# Patient Record
Sex: Male | Born: 1975 | Race: White | Hispanic: No | Marital: Single | Smoking: Current every day smoker
Health system: Southern US, Community
[De-identification: ages and names within clinical notes are randomized; demographics above are authoritative.]

## PROBLEM LIST (undated history)

## (undated) DIAGNOSIS — I2510 Atherosclerotic heart disease of native coronary artery without angina pectoris: Secondary | ICD-10-CM

## (undated) DIAGNOSIS — E079 Disorder of thyroid, unspecified: Secondary | ICD-10-CM

## (undated) HISTORY — PX: TYMPANOSTOMY TUBE PLACEMENT: SHX32

## (undated) HISTORY — DX: Atherosclerotic heart disease of native coronary artery without angina pectoris: I25.10

## (undated) HISTORY — DX: Disorder of thyroid, unspecified: E07.9

---
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
## 2006-02-16 ENCOUNTER — Emergency Department: Payer: Self-pay | Admitting: Internal Medicine

## 2006-10-04 ENCOUNTER — Emergency Department: Payer: Self-pay | Admitting: Emergency Medicine

## 2008-10-05 ENCOUNTER — Emergency Department: Payer: Self-pay | Admitting: Emergency Medicine

## 2010-11-11 ENCOUNTER — Ambulatory Visit: Payer: Self-pay | Admitting: Cardiology

## 2010-11-11 DIAGNOSIS — I2510 Atherosclerotic heart disease of native coronary artery without angina pectoris: Secondary | ICD-10-CM

## 2010-11-11 HISTORY — DX: Atherosclerotic heart disease of native coronary artery without angina pectoris: I25.10

## 2013-05-07 ENCOUNTER — Emergency Department: Payer: Self-pay | Admitting: Emergency Medicine

## 2013-05-07 LAB — CBC WITH DIFFERENTIAL/PLATELET
Basophil %: 0.3 %
Eosinophil %: 0.2 %
HCT: 44.3 % (ref 40.0–52.0)
HGB: 15.4 g/dL (ref 13.0–18.0)
Lymphocyte %: 7.9 %
MCHC: 34.8 g/dL (ref 32.0–36.0)
MCV: 91 fL (ref 80–100)
Monocyte #: 1.9 x10 3/mm — ABNORMAL HIGH (ref 0.2–1.0)
Neutrophil %: 81 %
Platelet: 153 10*3/uL (ref 150–440)
RBC: 4.87 10*6/uL (ref 4.40–5.90)

## 2013-05-07 LAB — MONONUCLEOSIS SCREEN: Mono Test: NEGATIVE

## 2013-09-08 ENCOUNTER — Emergency Department: Payer: Self-pay | Admitting: Emergency Medicine

## 2013-09-08 LAB — CBC
HGB: 13.7 g/dL (ref 13.0–18.0)
MCH: 32.1 pg (ref 26.0–34.0)
MCHC: 35.3 g/dL (ref 32.0–36.0)
MCV: 91 fL (ref 80–100)
Platelet: 209 10*3/uL (ref 150–440)
RBC: 4.27 10*6/uL — ABNORMAL LOW (ref 4.40–5.90)
RDW: 13.2 % (ref 11.5–14.5)
WBC: 10.1 10*3/uL (ref 3.8–10.6)

## 2013-09-08 LAB — BASIC METABOLIC PANEL
Calcium, Total: 8.9 mg/dL (ref 8.5–10.1)
EGFR (Non-African Amer.): >60
Glucose: 132 mg/dL — ABNORMAL HIGH (ref 65–99)
Osmolality: 284 (ref 275–301)

## 2013-09-08 LAB — TROPONIN I
Troponin-I: <0.02 ng/mL
Troponin-I: <0.02 ng/mL

## 2014-12-13 IMAGING — CT CT ANGIO AORTIC DISSECTION W/ CM
3 of 4 series · 6 of 16 positions shown, 7 images · IV contrast (APPLIED)
Comparison: none

REASON FOR EXAM: chest pain radiating to back
COMMENTS:

[Series 9: syng[place]ia · axial · 0.78mm/px · z∈[-600,-432]mm · 3 of 280 slices shown, 4 images]
[im 70/280  soft-tissue]
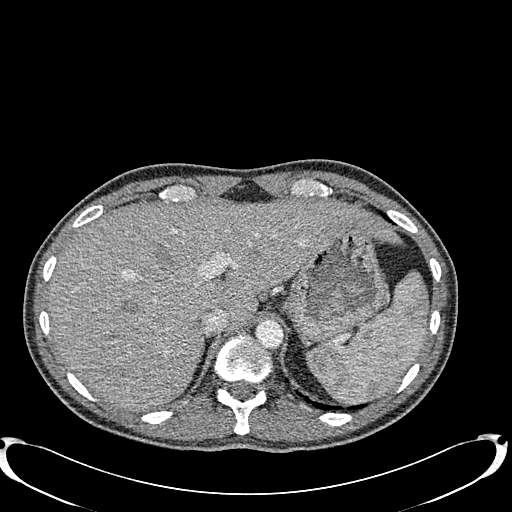
[im 70/280  bone]
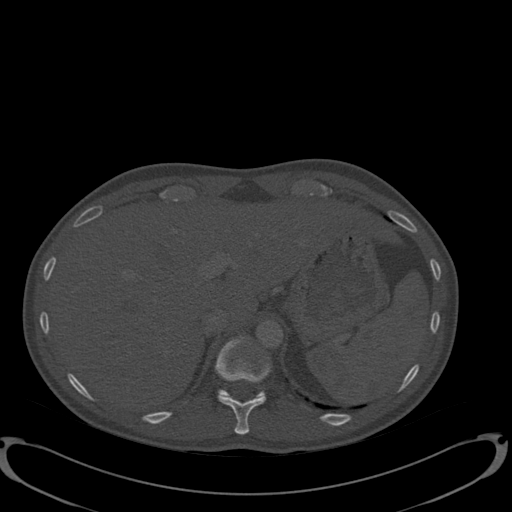
[im 140/280  soft-tissue]
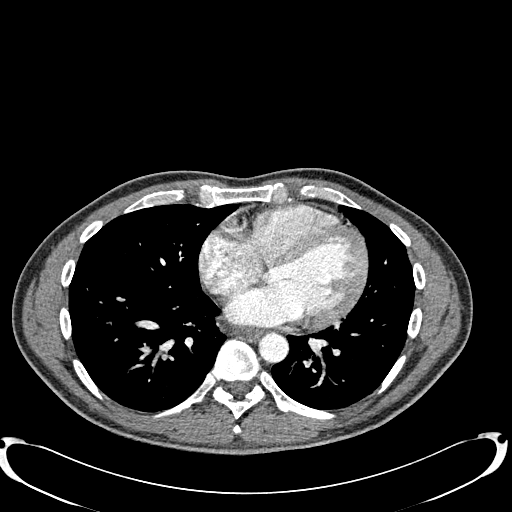
[im 210/280  soft-tissue]
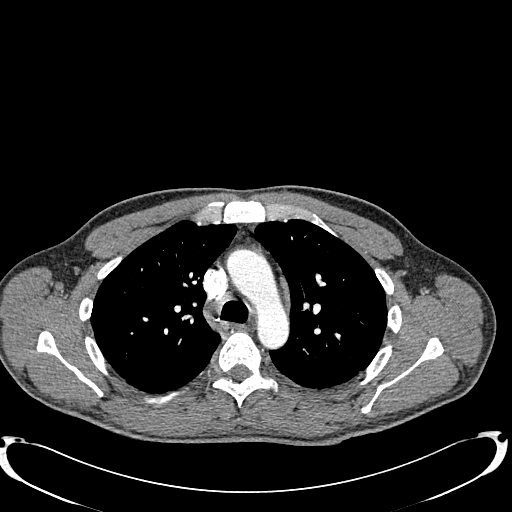

[Series 603: candy cane · sagittal · 0.78mm/px · 1 of 305 slices shown]
[im 153/305  soft-tissue]
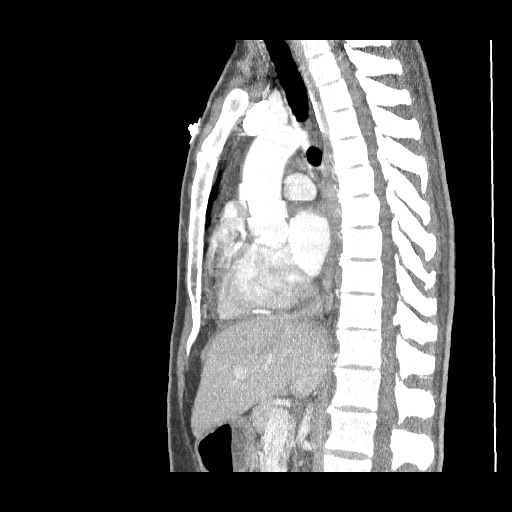

[Series 604: coronal chest · coronal · 0.78mm/px · 2 of 238 slices shown]
[im 80/238  soft-tissue]
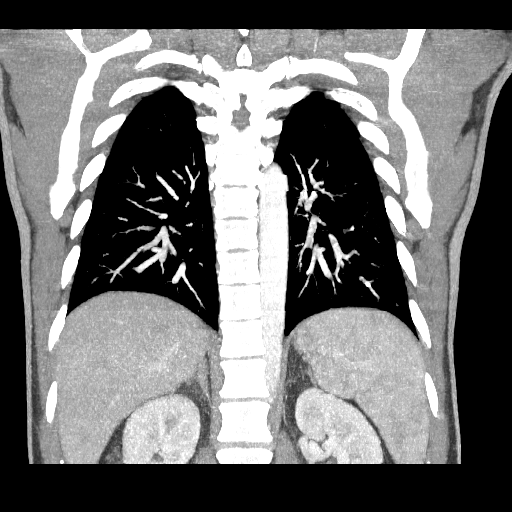
[im 159/238  soft-tissue]
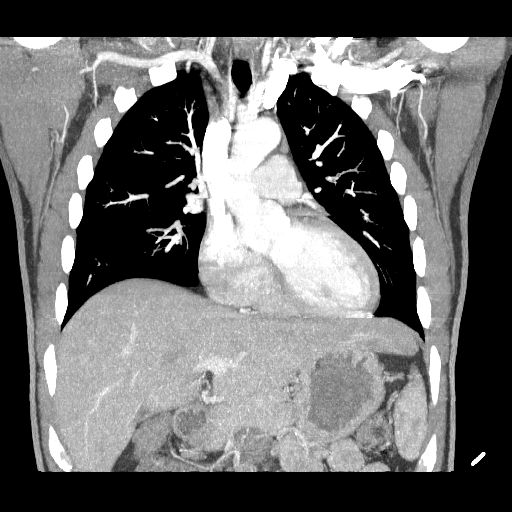

[6 of 16 positions shown; findings below may reference images not displayed]

PROCEDURE:     CT  - CT ANGIO CHEST W FOR DISSECTION  - September 08, 2013  [DATE]

RESULT:     Emergent CTA of the aorta is performed with cardiac gating with
images reconstructed in the axial plane at 3 mm slice thickness as well is
1.5 mm slice thickness with sagittal 8 mm thick slices and coronal
millimeter thick slices also performed. Three-dimensional reconstruction
with maximum intensity projection imaging is also [place]. There is
significant patient respiratory motion artifact which decreases sensitivity.
There is no pneumothorax. There is some small blebs in the lungs inferiorly.
There is no effusion or mass. The thoracic aorta is normal in caliber
without dissection area the pulmonary artery opacification is not optimal
for evaluation of pulmonary emboli, especially with the patient motion
artifact. No definite large central emboli are evident area there is no
mediastinal or hilar mass or adenopathy. Is no pleural or pericardial
effusion. The bony structures appear intact. There is a cyst in the upper
pole the right kidney. The other abdominal viscera appear grossly normal.
The gallbladder is nondistended.
IMPRESSION: 1. Normal appearance of the thoracic aorta. No evidence of aneurysm,
dissection or exsanguination.
2. The lungs are clear.

[REDACTED]

## 2015-09-18 ENCOUNTER — Ambulatory Visit (INDEPENDENT_AMBULATORY_CARE_PROVIDER_SITE_OTHER): Payer: 59 | Admitting: Family Medicine

## 2015-09-18 ENCOUNTER — Encounter: Payer: Self-pay | Admitting: Family Medicine

## 2015-09-18 VITALS — BP 116/79 | HR 71 | Temp 98.3°F | Ht 68.9 in | Wt 179.9 lb

## 2015-09-18 DIAGNOSIS — I2510 Atherosclerotic heart disease of native coronary artery without angina pectoris: Secondary | ICD-10-CM | POA: Diagnosis not present

## 2015-09-18 DIAGNOSIS — E039 Hypothyroidism, unspecified: Secondary | ICD-10-CM | POA: Diagnosis not present

## 2015-09-18 DIAGNOSIS — K921 Melena: Secondary | ICD-10-CM | POA: Diagnosis not present

## 2015-09-18 DIAGNOSIS — Z113 Encounter for screening for infections with a predominantly sexual mode of transmission: Secondary | ICD-10-CM

## 2015-09-18 LAB — UA/M W/RFLX CULTURE, ROUTINE
Bilirubin, UA: NEGATIVE
Glucose, UA: NEGATIVE
KETONES UA: NEGATIVE
LEUKOCYTES UA: NEGATIVE
NITRITE UA: NEGATIVE
PH UA: 6 (ref 5.0–7.5)
PROTEIN UA: NEGATIVE
RBC UA: NEGATIVE
Specific Gravity, UA: 1.01 (ref 1.005–1.030)
Urobilinogen, Ur: 0.2 mg/dL (ref 0.2–1.0)

## 2015-09-18 NOTE — Assessment & Plan Note (Signed)
Per patient report. Had cath with Dr. Evette GeorgesParashos. Will obtain records. Checking CMP and lipids today. Await results.

## 2015-09-18 NOTE — Assessment & Plan Note (Signed)
Was previously on synthroid. Will check TSH and await results and treat as needed. Labs drawn today.

## 2015-09-18 NOTE — Progress Notes (Signed)
BP 116/79 mmHg  Pulse 71  Temp(Src) 98.3 F (36.8 C)  Ht 5' 8.9" (1.75 m)  Wt 179 lb 14.4 oz (81.602 kg)  BMI 26.65 kg/m2  SpO2 98%   Subjective:    Patient ID: Nicholas Bridges, male    DOB: Nov 03, 1976, 39 y.o.   MRN: [id number]  HPI: Nicholas Bridges is a 39 y.o. male to establish care today  Chief Complaint  Patient presents with  . Establish Care    pt states he is worried about his thyroid. States he feels tired and ill all the time. Also states that he has random joint pains.   Had a problem with his thyroid a few years ago, can't remember if it was too high or too low. He was put on synthroid in the past, but hasn't been on it a long time. Taking the medicine made him feel much worse.   HYPOTHYROIDISM Thyroid control status:uncontrolled- has been off medicine for about 5 years Satisfied with current treatment? no Medication side effects: yes- felt like his thoughts were all over the place Medication compliance: has been off for about 5 years Recent dose adjustment:no Fatigue: yes Cold intolerance: yes Heat intolerance: yes Weight gain: yes Weight loss: no Constipation: no Diarrhea/loose stools: no Palpitations: yes Lower extremity edema: no Anxiety/depressed mood: yes   Can have some severe cramping in his belly, that occasionally gets better with food, and occasionally gets worse with food. Intermittently there is blood in his feces, concern about bleeding ulcer.  Had issues with his heart and had to see Dr. Cassie FreerParachos. Had a cath and states that he said that there was an area of unusual plaque. Has had his cholesterol checked at work yearly. Has been taking fish oil and increasing his fiber  Relevant past medical, surgical, family and social history reviewed and updated as indicated. Interim medical history since our last visit reviewed. Allergies and medications reviewed and updated.  Review of Systems  Constitutional: Negative.   Respiratory: Negative.    Cardiovascular: Negative.   Gastrointestinal: Negative.   Musculoskeletal: Negative.   Psychiatric/Behavioral: Negative.    Per HPI unless specifically indicated above     Objective:    BP 116/79 mmHg  Pulse 71  Temp(Src) 98.3 F (36.8 C)  Ht 5' 8.9" (1.75 m)  Wt 179 lb 14.4 oz (81.602 kg)  BMI 26.65 kg/m2  SpO2 98%  Wt Readings from Last 3 Encounters:  09/18/15 179 lb 14.4 oz (81.602 kg)    Physical Exam  Constitutional: He is oriented to person, place, and time. He appears well-developed and well-nourished. No distress.  HENT:  Head: Normocephalic and atraumatic.  Right Ear: Hearing normal.  Left Ear: Hearing normal.  Nose: Nose normal.  Eyes: Conjunctivae and lids are normal. Right eye exhibits no discharge. Left eye exhibits no discharge. No scleral icterus.  Cardiovascular: Normal rate, regular rhythm, normal heart sounds and intact distal pulses.  Exam reveals no gallop and no friction rub.   No murmur heard. Pulmonary/Chest: Effort normal and breath sounds normal. No respiratory distress. He has no wheezes. He has no rales. He exhibits no tenderness.  Musculoskeletal: Normal range of motion.  Neurological: He is alert and oriented to person, place, and time.  Skin: Skin is warm, dry and intact. No rash noted. No erythema. No pallor.  Psychiatric: He has a normal mood and affect. His speech is normal and behavior is normal. Judgment and thought content normal. Cognition and memory are normal.  Nursing  note and vitals reviewed.   Results for orders placed or performed in visit on 09/18/15  UA/M w/rflx Culture, Routine  Result Value Ref Range   Specific Gravity, UA 1.010 1.005 - 1.030   pH, UA 6.0 5.0 - 7.5   Color, UA Yellow Yellow   Appearance Ur Clear Clear   Leukocytes, UA Negative Negative   Protein, UA Negative Negative/Trace   Glucose, UA Negative Negative   Ketones, UA Negative Negative   RBC, UA Negative Negative   Bilirubin, UA Negative Negative    Urobilinogen, Ur 0.2 0.2 - 1.0 mg/dL   Nitrite, UA Negative Negative      Assessment & Plan:   Problem List Items Addressed This Visit      Cardiovascular and Mediastinum   Coronary artery disease involving native coronary artery of native heart without angina pectoris    Per patient report. Had cath with Dr. Evette Georges. Will obtain records. Checking CMP and lipids today. Await results.       Relevant Orders   CBC with Differential/Platelet   Comprehensive metabolic panel   Lipid Panel w/o Chol/HDL Ratio   TSH     Endocrine   Thyroid activity decreased - Primary    Was previously on synthroid. Will check TSH and await results and treat as needed. Labs drawn today.       Relevant Orders   TSH     Other   Hematochezia    Concerning for possible colitis with the cramping and the blood. Will check CMP and CBC and sent him home with FOBTx3. Low threshold for referral to GI for colonoscopy. Await results.       Relevant Orders   CBC with Differential/Platelet   UA/M w/rflx Culture, Routine (Completed)    Other Visit Diagnoses    Routine screening for STI (sexually transmitted infection)        Labs ordered today. Await results.     Relevant Orders    HIV antibody    GC/Chlamydia Probe Amp    HSV(herpes simplex vrs) 1+2 ab-IgG    Hepatitis, Acute    RPR        Follow up plan: Return 1-2 weeks, for PE, records release Dr. Cassie Freer and previous PCP.

## 2015-09-18 NOTE — Assessment & Plan Note (Signed)
Concerning for possible colitis with the cramping and the blood. Will check CMP and CBC and sent him home with FOBTx3. Low threshold for referral to GI for colonoscopy. Await results.

## 2015-09-18 NOTE — Patient Instructions (Signed)
Gastrointestinal Bleeding Gastrointestinal (GI) bleeding means there is bleeding somewhere along the digestive tract, between the mouth and anus. CAUSES  There are many different problems that can cause GI bleeding. Possible causes include:  Esophagitis. This is inflammation, irritation, or swelling of the esophagus.  Hemorrhoids.These are veins that are full of blood (engorged) in the rectum. They cause pain, inflammation, and may bleed.  Anal fissures.These are areas of painful tearing which may bleed. They are often caused by passing hard stool.  Diverticulosis.These are pouches that form on the colon over time, with age, and may bleed significantly.  Diverticulitis.This is inflammation in areas with diverticulosis. It can cause pain, fever, and bloody stools, although bleeding is rare.  Polyps and cancer. Colon cancer often starts out as precancerous polyps.  Gastritis and ulcers.Bleeding from the upper gastrointestinal tract (near the stomach) may travel through the intestines and produce black, sometimes tarry, often bad smelling stools. In certain cases, if the bleeding is fast enough, the stools may not be black, but red. This condition may be life-threatening. SYMPTOMS   Vomiting bright red blood or material that looks like coffee grounds.  Bloody, black, or tarry stools. DIAGNOSIS  Your caregiver may diagnose your condition by taking your history and performing a physical exam. More tests may be needed, including:  X-rays and other imaging tests.  Esophagogastroduodenoscopy (EGD). This test uses a flexible, lighted tube to look at your esophagus, stomach, and small intestine.  Colonoscopy. This test uses a flexible, lighted tube to look at your colon. TREATMENT  Treatment depends on the cause of your bleeding.   For bleeding from the esophagus, stomach, small intestine, or colon, the caregiver doing your EGD or colonoscopy may be able to stop the bleeding as part of  the procedure.  Inflammation or infection of the colon can be treated with medicines.  Many rectal problems can be treated with creams, suppositories, or warm baths.  Surgery is sometimes needed.  Blood transfusions are sometimes needed if you have lost a lot of blood. If bleeding is slow, you may be allowed to go home. If there is a lot of bleeding, you will need to stay in the hospital for observation. HOME CARE INSTRUCTIONS   Take any medicines exactly as prescribed.  Keep your stools soft by eating foods that are high in fiber. These foods include whole grains, legumes, fruits, and vegetables. Prunes (1 to 3 a day) work well for many people.  Drink enough fluids to keep your urine clear or pale yellow. SEEK IMMEDIATE MEDICAL CARE IF:   Your bleeding increases.  You feel lightheaded, weak, or you faint.  You have severe cramps in your back or abdomen.  You pass large blood clots in your stool.  Your problems are getting worse. MAKE SURE YOU:   Understand these instructions.  Will watch your condition.  Will get help right away if you are not doing well or get worse.   This information is not intended to replace advice given to you by your health care provider. Make sure you discuss any questions you have with your health care provider.   Document Released: 11/05/2000 Document Revised: 10/25/2012 Document Reviewed: 04/28/2015 Elsevier Interactive Patient Education 2016 [employer] Hypothyroidism Hypothyroidism is a disorder of the thyroid. The thyroid is a large gland that is located in the lower front of the neck. The thyroid releases hormones that control how the body works. With hypothyroidism, the thyroid does not make enough of these hormones. CAUSES Causes  of hypothyroidism may include:  Viral infections.  Pregnancy.  Your own defense system (immune system) attacking your thyroid.  Certain medicines.  Birth defects.  Past radiation treatments to your  head or neck.  Past treatment with radioactive iodine.  Past surgical removal of part or all of your thyroid.  Problems with the gland that is located in the center of your brain (pituitary). SIGNS AND SYMPTOMS Signs and symptoms of hypothyroidism may include:  Feeling as though you have no energy (lethargy).  Inability to tolerate cold.  Weight gain that is not explained by a change in diet or exercise habits.  Dry skin.  Coarse hair.  Menstrual irregularity.  Slowing of thought processes.  Constipation.  Sadness or depression. DIAGNOSIS  Your health care provider may diagnose hypothyroidism with blood tests and ultrasound tests. TREATMENT Hypothyroidism is treated with medicine that replaces the hormones that your body does not make. After you begin treatment, it may take several weeks for symptoms to go away. HOME CARE INSTRUCTIONS   Take medicines only as directed by your health care provider.  If you start taking any new medicines, tell your health care provider.  Keep all follow-up visits as directed by your health care provider. This is important. As your condition improves, your dosage needs may change. You will need to have blood tests regularly so that your health care provider can watch your condition. SEEK MEDICAL CARE IF:  Your symptoms do not get better with treatment.  You are taking thyroid replacement medicine and:  You sweat excessively.  You have tremors.  You feel anxious.  You lose weight rapidly.  You cannot tolerate heat.  You have emotional swings.  You have diarrhea.  You feel weak. SEEK IMMEDIATE MEDICAL CARE IF:   You develop chest pain.  You develop an irregular heartbeat.  You develop a rapid heartbeat.   This information is not intended to replace advice given to you by your health care provider. Make sure you discuss any questions you have with your health care provider.   Document Released: 11/08/2005 Document  Revised: 11/29/2014 Document Reviewed: 03/26/2014 Elsevier Interactive Patient Education [employer]

## 2015-09-19 ENCOUNTER — Telehealth: Payer: Self-pay | Admitting: Family Medicine

## 2015-09-19 DIAGNOSIS — E039 Hypothyroidism, unspecified: Secondary | ICD-10-CM

## 2015-09-19 LAB — COMPREHENSIVE METABOLIC PANEL
ALT: 17 IU/L (ref 0–44)
AST: 20 IU/L (ref 0–40)
Albumin/Globulin Ratio: 1.6 (ref 1.1–2.5)
Albumin: 4.6 g/dL (ref 3.5–5.5)
Alkaline Phosphatase: 87 IU/L (ref 39–117)
BILIRUBIN TOTAL: 0.5 mg/dL (ref 0.0–1.2)
BUN/Creatinine Ratio: 19 (ref 9–20)
BUN: 17 mg/dL (ref 6–24)
CALCIUM: 9.4 mg/dL (ref 8.7–10.2)
CHLORIDE: 100 mmol/L (ref 97–106)
CO2: 21 mmol/L (ref 18–29)
Creatinine, Ser: 0.88 mg/dL (ref 0.76–1.27)
GFR, EST AFRICAN AMERICAN: 124 mL/min/{1.73_m2} (ref >59)
GFR, EST NON AFRICAN AMERICAN: 107 mL/min/{1.73_m2} (ref >59)
GLOBULIN, TOTAL: 2.9 g/dL (ref 1.5–4.5)
Glucose: 87 mg/dL (ref 65–99)
Potassium: 4.1 mmol/L (ref 3.5–5.2)
Sodium: 137 mmol/L (ref 136–144)
Total Protein: 7.5 g/dL (ref 6.0–8.5)

## 2015-09-19 LAB — HEPATITIS PANEL, ACUTE
Hep A IgM: NEGATIVE
Hep B C IgM: NEGATIVE
Hepatitis B Surface Ag: NEGATIVE

## 2015-09-19 LAB — CBC WITH DIFFERENTIAL/PLATELET
BASOS: 0 %
Basophils Absolute: 0 10*3/uL (ref 0.0–0.2)
EOS (ABSOLUTE): 0.3 10*3/uL (ref 0.0–0.4)
Eos: 3 %
HEMATOCRIT: 44.1 % (ref 37.5–51.0)
Hemoglobin: 15.2 g/dL (ref 12.6–17.7)
IMMATURE GRANULOCYTES: 0 %
Immature Grans (Abs): 0 10*3/uL (ref 0.0–0.1)
LYMPHS ABS: 2.4 10*3/uL (ref 0.7–3.1)
Lymphs: 25 %
MCH: 31.3 pg (ref 26.6–33.0)
MCHC: 34.5 g/dL (ref 31.5–35.7)
MCV: 91 fL (ref 79–97)
MONOS ABS: 0.6 10*3/uL (ref 0.1–0.9)
Monocytes: 6 %
Neutrophils Absolute: 6.1 10*3/uL (ref 1.4–7.0)
Neutrophils: 66 %
Platelets: 215 10*3/uL (ref 150–379)
RBC: 4.86 x10E6/uL (ref 4.14–5.80)
RDW: 13 % (ref 12.3–15.4)
WBC: 9.4 10*3/uL (ref 3.4–10.8)

## 2015-09-19 LAB — HSV(HERPES SIMPLEX VRS) I + II AB-IGG
HSV 1 Glycoprotein G Ab, IgG: 14.5 index — ABNORMAL HIGH (ref 0.00–0.90)
HSV 2 Glycoprotein G Ab, IgG: <0.91 index (ref 0.00–0.90)

## 2015-09-19 LAB — LIPID PANEL W/O CHOL/HDL RATIO
Cholesterol, Total: 193 mg/dL (ref 100–199)
HDL: 43 mg/dL (ref >39)
LDL CALC: 135 mg/dL — AB (ref 0–99)
TRIGLYCERIDES: 76 mg/dL (ref 0–149)
VLDL Cholesterol Cal: 15 mg/dL (ref 5–40)

## 2015-09-19 LAB — TSH: TSH: 9.25 u[IU]/mL — AB (ref 0.450–4.500)

## 2015-09-19 LAB — HIV ANTIBODY (ROUTINE TESTING W REFLEX): HIV SCREEN 4TH GENERATION: NONREACTIVE

## 2015-09-19 LAB — RPR: RPR: NONREACTIVE

## 2015-09-19 MED ORDER — LEVOTHYROXINE SODIUM 25 MCG PO TABS: 25.0000 ug | ORAL_TABLET | Freq: Every day | ORAL | Status: DC

## 2015-09-19 NOTE — Telephone Encounter (Signed)
Labs look good, but his thyroid is under active. Will restart medicine at low dose. Return in 6 weeks for lab recheck. OK to change his PE appointment and push it out a couple of weeks.

## 2015-09-20 LAB — GC/CHLAMYDIA PROBE AMP
CHLAMYDIA, DNA PROBE: NEGATIVE
NEISSERIA GONORRHOEAE BY PCR: NEGATIVE

## 2015-09-22 ENCOUNTER — Telehealth: Payer: Self-pay | Admitting: Family Medicine

## 2015-09-22 NOTE — Telephone Encounter (Signed)
Called and gave him the results. He notes that he is feeling better on his thyroid medicine. Work is concerned about his mood. Needs a note for work stating he is being treated for his thyroid.

## 2015-10-10 ENCOUNTER — Encounter: Payer: Self-pay | Admitting: Family Medicine

## 2015-10-23 ENCOUNTER — Encounter: Payer: 59 | Admitting: Family Medicine

## 2015-11-07 ENCOUNTER — Encounter: Payer: Self-pay | Admitting: Family Medicine

## 2015-11-07 ENCOUNTER — Ambulatory Visit (INDEPENDENT_AMBULATORY_CARE_PROVIDER_SITE_OTHER): Payer: Self-pay | Admitting: Family Medicine

## 2015-11-07 VITALS — BP 110/74 | HR 68 | Temp 98.4°F | Ht 69.0 in | Wt 179.0 lb

## 2015-11-07 DIAGNOSIS — E039 Hypothyroidism, unspecified: Secondary | ICD-10-CM

## 2015-11-07 DIAGNOSIS — K921 Melena: Secondary | ICD-10-CM

## 2015-11-07 DIAGNOSIS — Z0000 Encounter for general adult medical examination without abnormal findings: Secondary | ICD-10-CM

## 2015-11-07 NOTE — Assessment & Plan Note (Signed)
Checking labs today. Adjust as needed. Continue to monitor.  

## 2015-11-07 NOTE — Progress Notes (Signed)
BP 110/74 mmHg  Pulse 68  Temp(Src) 98.4 F (36.9 C)  Ht 5\' 9"  (1.753 m)  Wt 179 lb (81.194 kg)  BMI 26.42 kg/m2  SpO2 96%   Subjective:    Patient ID: Nicholas Bridges, male    DOB: [birth year], 39 y.o.   MRN: [id number]  HPI: Nicholas Bridges is a 40 y.o. male presenting on 11/07/2015 for comprehensive medical examination. Current medical complaints include:  HYPOTHYROIDISM Thyroid control status:better Satisfied with current treatment? yes Medication side effects: no Medication compliance: excellent compliance Recent dose adjustment:yes Fatigue: no Cold intolerance: yes Heat intolerance: no Weight gain: no Weight loss: no Constipation: no Diarrhea/loose stools: no Palpitations: no Lower extremity edema: no Anxiety/depressed mood: no Having trouble with sleeping since starting the thyroid medicine  He currently lives with: mother in law and wife Interim Problems from his last visit: no  Depression Screen done today and results listed below:  Depression screen [facility] 2/9 09/18/2015  Decreased Interest 0  PHQ - 2 Score 0    Past Medical History:  Past Medical History  Diagnosis Date  . Thyroid disease   . CAD (coronary artery disease) 11/11/10    Insignificant disease with near 30% stenosis of mid LAD    Surgical History:  Past Surgical History  Procedure Laterality Date  . Nasal sinus surgery  1989  . Tympanostomy tube placement      Medications:  Current Outpatient Prescriptions on File Prior to Visit  Medication Sig  . levothyroxine (LEVOTHROID) 25 MCG tablet Take 1 tablet (25 mcg total) by mouth daily before breakfast. Return in 6 weeks to have your blood drawn  . Multiple Vitamin (MULTIVITAMIN) tablet Take 1 tablet by mouth daily.  . Omega-3 Fatty Acids (FISH OIL) 500 MG CAPS Take 500 mg by mouth daily.   No current facility-administered medications on file prior to visit.    Allergies:  No Known Allergies  Social History:  Social History    Social History  . Marital Status: Single    Spouse Name: N/A  . Number of Children: N/A  . Years of Education: N/A   Occupational History  . Not on file.   Social History Main Topics  . Smoking status: Current Every Day Smoker -- 1.00 packs/day    Types: Cigarettes  . Smokeless tobacco: Never Used  . Alcohol Use: 0.6 oz/week    1 Cans of beer per week  . Drug Use: Yes    Special: Marijuana     Comment: on occasion  . Sexual Activity: Yes   Other Topics Concern  . Not on file   Social History Narrative   History  Smoking status  . Current Every Day Smoker -- 1.00 packs/day  . Types: Cigarettes  Smokeless tobacco  . Never Used   History  Alcohol Use  . 0.6 oz/week  . 1 Cans of beer per week    Family History:  Family History  Problem Relation Age of Onset  . Epilepsy Mother   . Scleroderma Mother   . Heart disease Father     pacemaker  . Allergies Son     Past medical history, surgical history, medications, allergies, family history and social history reviewed with patient today and changes made to appropriate areas of the chart.   Review of Systems  Constitutional: Positive for chills. Negative for fever, weight loss, malaise/fatigue and diaphoresis.  HENT: Positive for congestion and sore throat. Negative for ear discharge, ear pain, hearing loss, nosebleeds and tinnitus.  Eyes: Negative.   Respiratory: Positive for cough. Negative for hemoptysis, sputum production, shortness of breath, wheezing and stridor.   Cardiovascular: Negative.   Gastrointestinal: Positive for heartburn and blood in stool (infrequently, makes him concerned. Bright red when it hits the water, dark when he wipes, happens 1x a month, usually following digestive issues). Negative for nausea, vomiting, abdominal pain, diarrhea, constipation and melena.  Genitourinary: Negative.   Musculoskeletal: Negative.   Skin: Negative.        Very dry skin on his L palm  Neurological: Positive  for tingling. Negative for dizziness, tremors, sensory change, speech change, focal weakness, seizures, loss of consciousness and weakness. Headaches: chronic at the back of his neck.  Endo/Heme/Allergies: Negative.   Psychiatric/Behavioral: Negative.     All other ROS negative except what is listed above and in the HPI.      Objective:    BP 110/74 mmHg  Pulse 68  Temp(Src) 98.4 F (36.9 C)  Ht  (1.753 m)  Wt 179 lb (81.194 kg)  BMI 26.42 kg/m2  SpO2 96%  Wt Readings from Last 3 Encounters:  11/07/15 179 lb (81.194 kg)  09/18/15 179 lb 14.4 oz (81.602 kg)    Physical Exam  Constitutional: He is oriented to person, place, and time. He appears well-developed and well-nourished. No distress.  HENT:  Head: Normocephalic and atraumatic.  Right Ear: Hearing, tympanic membrane, external ear and ear canal normal.  Left Ear: Hearing, tympanic membrane, external ear and ear canal normal.  Nose: Nose normal.  Mouth/Throat: Uvula is midline, oropharynx is clear and moist and mucous membranes are normal. No oropharyngeal exudate.  Eyes: Conjunctivae, EOM and lids are normal. Pupils are equal, round, and reactive to light. Right eye exhibits no discharge. Left eye exhibits no discharge. No scleral icterus.  Neck: Normal range of motion. Neck supple. No JVD present. No tracheal deviation present. No thyromegaly present.  Cardiovascular: Normal rate, regular rhythm, normal heart sounds and intact distal pulses.  Exam reveals no gallop and no friction rub.   No murmur heard. Pulmonary/Chest: Effort normal and breath sounds normal. No stridor. No respiratory distress. He has no wheezes. He has no rales. He exhibits no tenderness.  Abdominal: Soft. Bowel sounds are normal. He exhibits no distension and no mass. There is no tenderness. There is no rebound and no guarding. Hernia confirmed negative in the right inguinal area and confirmed negative in the left inguinal area.  Genitourinary:  Testes normal and penis normal. Circumcised. No penile tenderness.  Musculoskeletal: Normal range of motion. He exhibits no edema or tenderness.  Lymphadenopathy:    He has no cervical adenopathy.  Neurological: He is alert and oriented to person, place, and time. He has normal reflexes. He displays normal reflexes. No cranial nerve deficit. He exhibits normal muscle tone. Coordination normal.  Skin: Skin is warm, dry and intact. No rash noted. He is not diaphoretic. No erythema. No pallor.  Psychiatric: He has a normal mood and affect. His speech is normal and behavior is normal. Judgment and thought content normal. Cognition and memory are normal.  Nursing note and vitals reviewed.   Results for orders placed or performed in visit on 09/18/15  GC/Chlamydia Probe Amp  Result Value Ref Range   Chlamydia trachomatis, NAA Negative Negative   Neisseria gonorrhoeae by PCR Negative Negative  CBC with Differential/Platelet  Result Value Ref Range   WBC 9.4 3.4 - 10.8 x10E3/uL   RBC 4.86 4.14 - 5.80 x10E6/uL  Hemoglobin 15.2 12.6 - 17.7 g/dL   Hematocrit 47.844.1 29.537.5 - 51.0 %   MCV 91 79 - 97 fL   MCH 31.3 26.6 - 33.0 pg   MCHC 34.5 31.5 - 35.7 g/dL   RDW 62.113.0 30.812.3 - 65.715.4 %   Platelets 215 150 - 379 x10E3/uL   Neutrophils 66 %   Lymphs 25 %   Monocytes 6 %   Eos 3 %   Basos 0 %   Neutrophils Absolute 6.1 1.4 - 7.0 x10E3/uL   Lymphocytes Absolute 2.4 0.7 - 3.1 x10E3/uL   Monocytes Absolute 0.6 0.1 - 0.9 x10E3/uL   EOS (ABSOLUTE) 0.3 0.0 - 0.4 x10E3/uL   Basophils Absolute 0.0 0.0 - 0.2 x10E3/uL   Immature Granulocytes 0 %   Immature Grans (Abs) 0.0 0.0 - 0.1 x10E3/uL  Comprehensive metabolic panel  Result Value Ref Range   Glucose 87 65 - 99 mg/dL   BUN 17 6 - 24 mg/dL   Creatinine, Ser 8.460.88 0.76 - 1.27 mg/dL   GFR calc non Af Amer 107 >59 mL/min/1.73   GFR calc Af Amer 124 >59 mL/min/1.73   BUN/Creatinine Ratio 19 9 - 20   Sodium 137 136 - 144 mmol/L   Potassium 4.1 3.5 - 5.2  mmol/L   Chloride 100 97 - 106 mmol/L   CO2 21 18 - 29 mmol/L   Calcium 9.4 8.7 - 10.2 mg/dL   Total Protein 7.5 6.0 - 8.5 g/dL   Albumin 4.6 3.5 - 5.5 g/dL   Globulin, Total 2.9 1.5 - 4.5 g/dL   Albumin/Globulin Ratio 1.6 1.1 - 2.5   Bilirubin Total 0.5 0.0 - 1.2 mg/dL   Alkaline Phosphatase 87 39 - 117 IU/L   AST 20 0 - 40 IU/L   ALT 17 0 - 44 IU/L  Lipid Panel w/o Chol/HDL Ratio  Result Value Ref Range   Cholesterol, Total 193 100 - 199 mg/dL   Triglycerides 76 0 - 149 mg/dL   HDL 43 >96>39 mg/dL   VLDL Cholesterol Cal 15 5 - 40 mg/dL   LDL Calculated 295135 (H) 0 - 99 mg/dL  TSH  Result Value Ref Range   TSH 9.250 (H) 0.450 - 4.500 uIU/mL  UA/M w/rflx Culture, Routine  Result Value Ref Range   Specific Gravity, UA 1.010 1.005 - 1.030   pH, UA 6.0 5.0 - 7.5   Color, UA Yellow Yellow   Appearance Ur Clear Clear   Leukocytes, UA Negative Negative   Protein, UA Negative Negative/Trace   Glucose, UA Negative Negative   Ketones, UA Negative Negative   RBC, UA Negative Negative   Bilirubin, UA Negative Negative   Urobilinogen, Ur 0.2 0.2 - 1.0 mg/dL   Nitrite, UA Negative Negative  HIV antibody  Result Value Ref Range   HIV Screen 4th Generation wRfx Non Reactive Non Reactive  HSV(herpes simplex vrs) 1+2 ab-IgG  Result Value Ref Range   HSV 1 Glycoprotein G Ab, IgG 14.50 (H) 0.00 - 0.90 index   HSV 2 Glycoprotein G Ab, IgG <0.91 0.00 - 0.90 index  Hepatitis, Acute  Result Value Ref Range   Hep A IgM Negative Negative   Hepatitis B Surface Ag Negative Negative   Hep B C IgM Negative Negative   Hep C Virus Ab <0.1 0.0 - 0.9 s/co ratio  RPR  Result Value Ref Range   RPR Ser Ql Non Reactive Non Reactive      Assessment & Plan:   Problem List Items Addressed  This Visit      Endocrine   Thyroid activity decreased    Checking labs today. Adjust as needed. Continue to monitor.         Other   Hematochezia    Will send to GI for eval, but will wait until he has  insurance as this is chronic. Continue to monitor.        Other Visit Diagnoses    Routine general medical examination at a health care facility    -  Primary    Up to date on labs and vaccines. Conitnue to work on diet and exercise. Continue to monitor. Follow up with insurance to avoid bills, or sooner.         LABORATORY TESTING:  Health maintenance labs ordered today as discussed above.    IMMUNIZATIONS:   - Tdap: Tetanus vaccination status reviewed: last tetanus booster within 10 years. - Influenza: Refused - Pneumovax: Refused   PATIENT COUNSELING:    Sexuality: Discussed sexually transmitted diseases, partner selection, use of condoms, avoidance of unintended pregnancy  and contraceptive alternatives.   Advised to avoid cigarette smoking.  I discussed with the patient that most people either abstain from alcohol or drink within safe limits (<=14/week and <=4 drinks/occasion for males, <=7/weeks and <= 3 drinks/occasion for females) and that the risk for alcohol disorders and other health effects rises proportionally with the number of drinks per week and how often a drinker exceeds daily limits.  Discussed cessation/primary prevention of drug use and availability of treatment for abuse.   Diet: Encouraged to adjust caloric intake to maintain  or achieve ideal body weight, to reduce intake of dietary saturated fat and total fat, to limit sodium intake by avoiding high sodium foods and not adding table salt, and to maintain adequate dietary potassium and calcium preferably from fresh fruits, vegetables, and low-fat dairy products.    stressed the importance of regular exercise  Injury prevention: Discussed safety belts, safety helmets, smoke detector, smoking near bedding or upholstery.   Dental health: Discussed importance of regular tooth brushing, flossing, and dental visits.   Follow up plan: NEXT PREVENTATIVE PHYSICAL DUE IN 1 YEAR. Return February , for Follow  up.

## 2015-11-07 NOTE — Assessment & Plan Note (Signed)
Will send to GI for eval, but will wait until he has insurance as this is chronic. Continue to monitor.

## 2015-11-07 NOTE — Patient Instructions (Signed)

## 2015-11-08 LAB — T3: T3, Total: 120 ng/dL (ref 71–180)

## 2015-11-08 LAB — TSH: TSH: 6.1 u[IU]/mL — ABNORMAL HIGH (ref 0.450–4.500)

## 2015-11-08 LAB — T4: T4 TOTAL: 7.9 ug/dL (ref 4.5–12.0)

## 2015-11-11 ENCOUNTER — Telehealth: Payer: Self-pay | Admitting: Family Medicine

## 2015-11-11 MED ORDER — LEVOTHYROXINE SODIUM 50 MCG PO TABS: 50.0000 ug | ORAL_TABLET | Freq: Every day | ORAL | Status: DC

## 2015-11-11 NOTE — Telephone Encounter (Signed)
I spoke with patient, explained that his thyroid medicine needs to be adjusted a little more; no real problems,  Just a little stress with work and holidays but nothing suggestive of over-replacement; increase from 25 mcg to 50 mcg daily; recheck TSH in 6-8 weeks; new Rx sent; call with any problems ------------------------ Dr. Laural BenesJohnson, if it's okay, I'll let you put the orders in for the follow-up labs so those don't come to me in February

## 2015-11-11 NOTE — Telephone Encounter (Signed)
I called to discuss lab result; left brief msg

## 2015-11-11 NOTE — Telephone Encounter (Signed)
Patient returned your call.

## 2015-11-11 NOTE — Telephone Encounter (Signed)
Pt returned call , please call pt back @ your earliest convenience. Pt is at work. Thanks.

## 2015-11-18 ENCOUNTER — Other Ambulatory Visit: Payer: Self-pay | Admitting: Family Medicine

## 2015-11-18 DIAGNOSIS — E039 Hypothyroidism, unspecified: Secondary | ICD-10-CM

## 2016-01-09 ENCOUNTER — Ambulatory Visit: Payer: Self-pay | Admitting: Family Medicine

## 2016-01-16 ENCOUNTER — Other Ambulatory Visit: Payer: Self-pay | Admitting: Family Medicine

## 2016-01-16 NOTE — Telephone Encounter (Signed)
Called patient to have him come in for thyroid labs (only lab appt needed) If patient is completely out of his medication MJ will call in some until he can come in for labs to make sure his dosage is correct.  Once labs are done she will write for one year.

## 2016-01-16 NOTE — Telephone Encounter (Signed)
Please have him come in for lab visit- will refill once we know he's on the right dose.

## 2016-01-27 ENCOUNTER — Encounter: Payer: Self-pay | Admitting: Family Medicine

## 2016-01-27 ENCOUNTER — Ambulatory Visit (INDEPENDENT_AMBULATORY_CARE_PROVIDER_SITE_OTHER): Payer: Self-pay | Admitting: Family Medicine

## 2016-01-27 VITALS — BP 127/82 | HR 66 | Temp 97.6°F | Ht 69.1 in | Wt 192.0 lb

## 2016-01-27 DIAGNOSIS — E039 Hypothyroidism, unspecified: Secondary | ICD-10-CM

## 2016-01-27 DIAGNOSIS — K047 Periapical abscess without sinus: Secondary | ICD-10-CM

## 2016-01-27 DIAGNOSIS — M79605 Pain in left leg: Secondary | ICD-10-CM

## 2016-01-27 MED ORDER — AMOXICILLIN-POT CLAVULANATE 875-125 MG PO TABS: 1.0000 | ORAL_TABLET | Freq: Two times a day (BID) | ORAL | Status: DC

## 2016-01-27 NOTE — Patient Instructions (Signed)
Hamstring Strain With Rehab The hamstring muscle and tendons are vulnerable to muscle or tendon tear (strain). Hamstring tears cause pain and inflammation in the backside of the thigh, where the hamstring muscles are located. The hamstring is comprised of three muscles that are responsible for straightening the hip, bending the knee, and stabilizing the knee. These muscles are important for walking, running, and jumping. Hamstring strain is the most common injury of the thigh. Hamstring strains are classified as grade 1 or 2 strains. Grade 1 strains cause pain, but the tendon is not lengthened. Grade 2 strains include a lengthened ligament due to the ligament being stretched or partially ruptured. With grade 2 strains there is still function, although the function may be decreased.  SYMPTOMS   Pain, tenderness, swelling, warmth, or redness over the hamstring muscles, at the back of the thigh.  Pain that gets worse during and after intense activity.  A "pop" heard in the area, at the time of injury.  Muscle spasm in the hamstring muscles.  Pain or weakness with running, jumping, or bending the knee against resistance.  Crackling sound (crepitation) when the tendon is moved or touched.  Bruising (contusion) in the thigh within 48 hours of injury.  Loss of fullness of the muscle, or area of muscle bulging in the case of a complete rupture. CAUSES  A muscle strain occurs when a force is placed on the muscle or tendon that is greater than it can withstand. Common causes of injury include:  Strain from overuse or sudden increase in the frequency, duration, or intensity of activity.  Single violent blow or force to the back of the knee or the hamstring area of the thigh. RISK INCREASES WITH:  Sports that require quick starts (sprinting, racquetball, tennis).  Sports that require jumping (basketball, volleyball).  Kicking sports and water skiing.  Contact sports (soccer, football).  Poor  strength and flexibility.  Failure to warm up properly before activity.  Previous thigh, knee, or pelvis injury.  Poor exercise technique.  Poor posture. PREVENTION  Maintain physical fitness:  Strength, flexibility, and endurance.  Cardiovascular fitness.  Learn and use proper exercise technique and posture.  Wear proper fitted and padded protective equipment. PROGNOSIS  If treated properly, hamstring strains are usually curable in 2 to 6 weeks. RELATED COMPLICATIONS   Longer healing time, if not properly treated or if not given adequate time to heal.  Chronically inflamed tendon, causing persistent pain with activity that may progress to constant pain.  Recurring symptoms, if activity is resumed too soon.  Vulnerable to repeated injury (in up to 25% of cases). TREATMENT  Treatment first involves the use of ice and medication to help reduce pain and inflammation. It is also important to complete strengthening and stretching exercises, as well as modifying any activities that aggravate the symptoms. These exercises may be completed at home or with a therapist. Your caregiver may recommend the use of crutches to help reduce pain and discomfort, especially is the strain is severe enough to cause limping. If the tendon has pulled away from the bone, then surgery is necessary to reattach it. MEDICATION   If pain medicine is needed, nonsteroidal anti-inflammatory medicines (aspirin and ibuprofen), or other minor pain relievers (acetaminophen), are often advised.  Do not take pain medicine for 7 days before surgery.  Prescription pain relievers may be given if your caregiver thinks they are needed. Use only as directed and only as much as you need.  Corticosteroid injections may be   recommended. However, these injections should only be used for serious cases, as they can only be given a certain number of times.  Ointments applied to the skin may be beneficial. HEAT AND  COLD  Cold treatment (icing) relieves pain and reduces inflammation. Cold treatment should be applied for 10 to 15 minutes every 2 to 3 hours, and immediately after activity that aggravates your symptoms. Use ice packs or an ice massage.  Heat treatment may be used before performing the stretching and strengthening activities prescribed by your caregiver, physical therapist, or athletic trainer. Use a heat pack or a warm water soak. SEEK MEDICAL CARE IF:   Symptoms get worse or do not improve in 2 weeks, despite treatment.  New, unexplained symptoms develop. (Drugs used in treatment may produce side effects.) EXERCISES RANGE OF MOTION (ROM) AND STRETCHING EXERCISES - Hamstring Strain These exercises may help you when beginning to rehabilitate your injury. Your symptoms may go away with or without further involvement from your physician, physical therapist or athletic trainer. While completing these exercises, remember:   Restoring tissue flexibility helps normal motion to return to the joints. This allows healthier, less painful movement and activity.  An effective stretch should be held for at least 30 seconds.  A stretch should never be painful. You should only feel a gentle lengthening or release in the stretched tissue. STRETCH - Hamstrings, Standing  Stand or sit, and extend your right / left leg, placing your foot on a chair or foot stool.  Keep a slight arch in your low back and your hips straight forward.  Lead with your chest, and lean forward at the waist until you feel a gentle stretch in the back of your right / left knee or thigh. (When done correctly, this exercise requires leaning only a small distance.)  Hold this position for __________ seconds. Repeat __________ times. Complete this stretch __________ times per day. STRETCH - Hamstrings, Supine   Lie on your back. Loop a belt or towel over the ball of your right / left foot.  Straighten your right / left knee and  slowly pull on the belt to raise your leg. Do not allow the right / left knee to bend. Keep your opposite leg flat on the floor.  Raise the leg until you feel a gentle stretch behind your right / left knee or thigh. Hold this position for __________ seconds. Repeat __________ times. Complete this stretch __________ times per day.  STRETCH - Hamstrings, Doorway  Lie on your back with your right / left leg extended and resting on the wall, and the opposite leg flat on the ground through the door. Initially, position your bottom farther away from the wall.  Keep your right / left knee straight. If you feel a stretch behind your knee or thigh, hold this position for __________ seconds.  If you do not feel a stretch, scoot your bottom closer to the door and hold __________ seconds. Repeat __________ times. Complete this stretch __________ times per day.  STRETCH - Hamstrings/Adductors, V-Sit   Sit on the floor with your legs extended in a large "V," keeping your knees straight.  With your head and chest upright, bend at your waist reaching for your left foot to stretch your right thigh muscles.  You should feel a stretch in your right inner thigh. Hold for __________ seconds.  Return to the upright position to relax your leg muscles.  Continuing to keep your chest upright, bend straight forward at your   waist to stretch your hamstrings.  You should feel a stretch behind both of your thighs and knees. Hold for __________ seconds.  Return to the upright position to relax your leg muscles.  With your head and chest upright, bend at your waist reaching for your right foot to stretch your left thigh muscles.  You should feel a stretch in your left inner thigh. Hold for __________ seconds.  Return to the upright position to relax your leg muscles. Repeat __________ times. Complete this exercise __________ times per day.  STRENGTHENING EXERCISES - Hamstring Strain These exercises may help you  when beginning to rehabilitate your injury. They may resolve your symptoms with or without further involvement from your physician, physical therapist or athletic trainer. While completing these exercises, remember:   Muscles can gain both the endurance and the strength needed for everyday activities through controlled exercises.  Complete these exercises as instructed by your physician, physical therapist or athletic trainer. Increase the resistance and repetitions only as guided.  You may experience muscle soreness or fatigue, but the pain or discomfort you are trying to eliminate should never get worse during these exercises. If this pain does get worse, stop and make certain you are following the directions exactly. If the pain is still present after adjustments, discontinue the exercise until you can discuss the trouble with your clinician. STRENGTH - Hip Extensors, Straight Leg Raises   Lie on your stomach on a firm surface.  Tense the muscles in your buttocks to lift your right / left leg about 4 inches. If you cannot lift your leg this high without arching your back, place a pillow under your hips.  Keep your knee straight. Hold for __________ seconds.  Slowly lower your leg to the starting position and allow it to relax completely before starting the next repetition. Repeat __________ times. Complete this exercise __________ times per day.  STRENGTH - Hamstring, Isometrics   Lie on your back on a firm surface.  Bend your right / left knee approximately __________ degrees.  Dig your heel into the surface, as if you are trying to pull it toward your buttocks. Tighten the muscles in the back of your thighs to "dig" as hard as you can, without increasing any pain.  Hold this position for __________ seconds.  Release the tension gradually and allow your muscles to completely relax for __________ seconds between each exercise. Repeat __________ times. Complete this exercise __________  times per day.  STRENGTH - Hamstring, Curls   Lay on your stomach with your legs extended. (If you lay on a bed, your feet may hang over the edge.)  Tighten the muscles in the back of your thigh to bend your right / left knee up to 90 degrees. Keep your hips flat on the bed or floor.  Hold this position for __________ seconds.  Slowly lower your leg back to the starting position. Repeat __________ times. Complete this exercise __________ times per day.  OPTIONAL ANKLE WEIGHTS: Begin with ____________________, but DO NOT exceed ____________________. Increase in 1 pound/0.5 kilogram increments.   This information is not intended to replace advice given to you by your health care provider. Make sure you discuss any questions you have with your health care provider.   Document Released: 11/08/2005 Document Revised: 11/29/2014 Document Reviewed: 02/20/2009 [employer] Interactive Patient Education 2016 [employer]  

## 2016-01-27 NOTE — Progress Notes (Signed)
BP 127/82 mmHg  Pulse 66  Temp(Src) 97.6 F (36.4 C)  Ht 5' 9.1" (1.755 m)  Wt 192 lb (87.091 kg)  BMI 28.28 kg/m2  SpO2 99%   Subjective:    Patient ID: Nicholas Bridges, male    DOB: Jul 11, 1976, 40 y.o.   MRN: [id number]  HPI: Nicholas Bridges is a 40 y.o. male  Chief Complaint  Patient presents with  . Hypothyroidism   HYPOTHYROIDISM Thyroid control status:better Satisfied with current treatment? yes Medication side effects: no Medication compliance: excellent compliance Recent dose adjustment:yes Fatigue: no Cold intolerance: yes Heat intolerance: no Weight gain: yes Weight loss: no Constipation: no Diarrhea/loose stools: no Palpitations: no Lower extremity edema: no Anxiety/depressed mood: yes  DENTAL PAIN Duration: yesterday Involved teeth: left and lower Dentist evaluation: no- going shortly Mechanism of injury:  unknown Onset: sudden Severity: 5/10 Quality: aching  Frequency: constant Radiation: none Aggravating factors: chewing Alleviating factors: NSAIDs Status: worse Treatments attempted: NSAIDs Relief with NSAIDs?: mild Fevers: no Swelling: yes Redness: no Paresthesias / decreased sensation: no Sinus pressure: no  LEG CRAMPS Duration: chronic Pain: yes Severity: mild  Quality:  cramping Location:  thighs Bilateral:  no Onset: sudden Frequency: intermittent Time of  day:   at random Sudden unintentional leg jerking:   yes Paresthesias:   no Decreased sensation:  no Weakness:   no Insomnia:   no Fatigue:   no Alleviating factors: sitting then has cramping in hamstring on the L only Aggravating factors: stretching Status: stable Treatments attempted: stretching    Relevant past medical, surgical, family and social history reviewed and updated as indicated. Interim medical history since our last visit reviewed. Allergies and medications reviewed and updated.  Review of Systems  Constitutional: Negative.   Respiratory:  Negative.   Cardiovascular: Negative.   Musculoskeletal: Positive for myalgias. Negative for back pain, joint swelling, arthralgias, gait problem, neck pain and neck stiffness.  Psychiatric/Behavioral: Negative.     Per HPI unless specifically indicated above     Objective:    BP 127/82 mmHg  Pulse 66  Temp(Src) 97.6 F (36.4 C)  Ht 5' 9.1" (1.755 m)  Wt 192 lb (87.091 kg)  BMI 28.28 kg/m2  SpO2 99%  Wt Readings from Last 3 Encounters:  01/27/16 192 lb (87.091 kg)  11/07/15 179 lb (81.194 kg)  09/18/15 179 lb 14.4 oz (81.602 kg)    Physical Exam  Constitutional: He is oriented to person, place, and time. He appears well-developed and well-nourished. No distress.  HENT:  Head: Normocephalic and atraumatic.  Right Ear: Hearing normal.  Left Ear: Hearing normal.  Nose: Nose normal.  Eyes: Conjunctivae and lids are normal. Right eye exhibits no discharge. Left eye exhibits no discharge. No scleral icterus.  Pulmonary/Chest: Effort normal. No respiratory distress.  Musculoskeletal: Normal range of motion.  Neurological: He is alert and oriented to person, place, and time.  Skin: Skin is intact. No rash noted.  Psychiatric: He has a normal mood and affect. His speech is normal and behavior is normal. Judgment and thought content normal. Cognition and memory are normal.    Results for orders placed or performed in visit on 11/07/15  TSH  Result Value Ref Range   TSH 6.100 (H) 0.450 - 4.500 uIU/mL  T3  Result Value Ref Range   T3, Total 120 71 - 180 ng/dL  T4  Result Value Ref Range   T4, Total 7.9 4.5 - 12.0 ug/dL      Assessment & Plan:  Problem List Items Addressed This Visit      Endocrine   Thyroid activity decreased - Primary    Rechecking levels today and will adjust as needed. Feeling better and like this might be the right dose. Await results.        Other Visit Diagnoses    Tooth abscess        Will treat with augmentin. Follow up with his dentist.  Call with any concerns.     Pain of left lower extremity        Seems to just be due to sitting on it. Will try some hamstring stretches, if not getting better, he will let us know.         Follow up plan: Return in about 6 months (around 07/29/2016) for physical.

## 2016-01-27 NOTE — Assessment & Plan Note (Signed)
Rechecking levels today and will adjust as needed. Feeling better and like this might be the right dose. Await results.

## 2016-01-28 ENCOUNTER — Telehealth: Payer: Self-pay | Admitting: Family Medicine

## 2016-01-28 DIAGNOSIS — E039 Hypothyroidism, unspecified: Secondary | ICD-10-CM

## 2016-01-28 LAB — THYROID PANEL WITH TSH
Free Thyroxine Index: 2 (ref 1.2–4.9)
T3 Uptake Ratio: 30 % (ref 24–39)
T4 TOTAL: 6.8 ug/dL (ref 4.5–12.0)
TSH: 10.27 u[IU]/mL — ABNORMAL HIGH (ref 0.450–4.500)

## 2016-01-28 MED ORDER — LEVOTHYROXINE SODIUM 75 MCG PO TABS: 75.0000 ug | ORAL_TABLET | Freq: Every day | ORAL | Status: DC

## 2016-01-28 NOTE — Telephone Encounter (Signed)
Called to let him know that his thyroid is still underactive. To increase his dose. New Rx sent to his pharmacy. Needs to return for lab appointment only in 6 weeks (order put in if we can get him a lab appointment scheduled) OK to give him this message if he calls.

## 2016-01-28 NOTE — Telephone Encounter (Signed)
Patient returned Dr. Henriette CombsJohnson's call.  Advised patient of lab results as indicated below and scheduled patient an appt for follow up labs in 6 weeks.

## 2016-03-10 ENCOUNTER — Other Ambulatory Visit: Payer: Self-pay

## 2016-03-23 ENCOUNTER — Other Ambulatory Visit: Payer: Self-pay

## 2016-03-23 DIAGNOSIS — E039 Hypothyroidism, unspecified: Secondary | ICD-10-CM

## 2016-03-24 ENCOUNTER — Telehealth: Payer: Self-pay | Admitting: Family Medicine

## 2016-03-24 ENCOUNTER — Other Ambulatory Visit: Payer: Self-pay | Admitting: Family Medicine

## 2016-03-24 DIAGNOSIS — E039 Hypothyroidism, unspecified: Secondary | ICD-10-CM

## 2016-03-24 LAB — TSH: TSH: 6.14 u[IU]/mL — ABNORMAL HIGH (ref 0.450–4.500)

## 2016-03-24 MED ORDER — LEVOTHYROXINE SODIUM 88 MCG PO TABS: 88.0000 ug | ORAL_TABLET | Freq: Every day | ORAL | Status: DC

## 2016-03-24 NOTE — Telephone Encounter (Signed)
Please let him know that his thyroid is a still a little under active. Let's go up on your medicine and check it again in 6 weeks. I've sent a new Rx for 88 mcg to his pharmacy and put the order in.

## 2016-03-24 NOTE — Telephone Encounter (Signed)
Called and left message for patient to return my call.

## 2016-03-25 NOTE — Telephone Encounter (Signed)
Patient notified, lab appointment scheduled

## 2016-05-06 ENCOUNTER — Other Ambulatory Visit: Payer: Self-pay

## 2016-05-14 ENCOUNTER — Other Ambulatory Visit: Payer: Self-pay

## 2016-07-02 ENCOUNTER — Telehealth: Payer: Self-pay | Admitting: Family Medicine

## 2016-07-02 MED ORDER — LEVOTHYROXINE SODIUM 88 MCG PO TABS
88.0000 ug | ORAL_TABLET | Freq: Every day | ORAL | 1 refills | Status: DC
Start: 2016-07-02 — End: 2016-09-21

## 2016-07-02 NOTE — Telephone Encounter (Signed)
Rx sent to his pharmacy. Please have him come in in 6 weeks to have his labs checked. Probably would be best for him to have an appointment for that. Thanks!

## 2016-07-02 NOTE — Telephone Encounter (Signed)
Called and left patient a voicemail letting him know that his medication was sent to the pharmacy. I let him know that Dr. Laural BenesJohnson wants him to come by in about 6 weeks for labs. I asked for the patient to give us a call to schedule that appointment for him.

## 2016-07-02 NOTE — Telephone Encounter (Signed)
Returned patient's call, no answer, LVM  According to chart patient should have been out of meds in June as he was supposed to come in and have TSH bloodwork rechecked (order is in)  If he has been out of medication, Dr. Laural BenesJohnson may want to send in refill and have labs drawn again in a few weeks.    If he has been taking the 88mg  dose he will need to come in for TSH to make sure this is the correct dose before she refills.

## 2016-07-02 NOTE — Telephone Encounter (Signed)
Spoke with patient, he has not taken 88mg  in 2 weeks, I told him we would call him Monday to let him know if you want to send in rx and recheck bloodwork or just do bloodwork.

## 2016-09-15 ENCOUNTER — Other Ambulatory Visit: Payer: Self-pay | Admitting: Family Medicine

## 2016-09-15 NOTE — Telephone Encounter (Signed)
Needs labs done

## 2016-09-17 ENCOUNTER — Other Ambulatory Visit: Payer: No Typology Code available for payment source

## 2016-09-17 DIAGNOSIS — Z113 Encounter for screening for infections with a predominantly sexual mode of transmission: Secondary | ICD-10-CM

## 2016-09-18 LAB — HEPATITIS PANEL, ACUTE
HEP B C IGM: NEGATIVE
Hep A IgM: NEGATIVE
Hep C Virus Ab: <0.1 s/co ratio (ref 0.0–0.9)
Hepatitis B Surface Ag: NEGATIVE

## 2016-09-18 LAB — RPR: RPR: NONREACTIVE

## 2016-09-18 LAB — HSV(HERPES SIMPLEX VRS) I + II AB-IGG: HSV 1 Glycoprotein G Ab, IgG: 11.3 index — ABNORMAL HIGH (ref 0.00–0.90)

## 2016-09-18 LAB — HIV ANTIBODY (ROUTINE TESTING W REFLEX): HIV SCREEN 4TH GENERATION: NONREACTIVE

## 2016-09-21 ENCOUNTER — Telehealth: Payer: Self-pay | Admitting: Family Medicine

## 2016-09-21 LAB — SPECIMEN STATUS REPORT

## 2016-09-21 LAB — THYROID PANEL WITH TSH
FREE THYROXINE INDEX: 1.6 (ref 1.2–4.9)
T3 Uptake Ratio: 26 % (ref 24–39)
T4, Total: 6.2 ug/dL (ref 4.5–12.0)
TSH: 4.14 u[IU]/mL (ref 0.450–4.500)

## 2016-09-21 MED ORDER — LEVOTHYROXINE SODIUM 88 MCG PO TABS
88.0000 ug | ORAL_TABLET | Freq: Every day | ORAL | 12 refills | Status: DC
Start: 2016-09-21 — End: 2017-10-14

## 2016-09-21 NOTE — Telephone Encounter (Signed)
Patient notified

## 2016-09-21 NOTE — Telephone Encounter (Signed)
Please let him know that his labs are normal except he tested positive for the cold sore virus. We have not gotten his urine yet. His thyroid looked good, so I sent a years supply to his pharmacy. Thanks!

## 2016-09-21 NOTE — Telephone Encounter (Signed)
Pt called asking to speak with Dr. Laural BenesJohnson about test results. I took a message for him

## 2016-09-23 LAB — GC/CHLAMYDIA PROBE AMP
Chlamydia trachomatis, NAA: NEGATIVE
NEISSERIA GONORRHOEAE BY PCR: NEGATIVE

## 2016-09-24 ENCOUNTER — Telehealth: Payer: Self-pay | Admitting: Family Medicine

## 2016-09-24 NOTE — Telephone Encounter (Signed)
Patient notified

## 2016-09-24 NOTE — Telephone Encounter (Signed)
Please call pt and let him know that his GC Chlamydia results were negative. Thanks

## 2016-09-24 NOTE — Telephone Encounter (Signed)
Left message to call.

## 2016-09-27 ENCOUNTER — Telehealth: Payer: Self-pay

## 2016-09-27 NOTE — Telephone Encounter (Signed)
Result has been released

## 2016-09-27 NOTE — Telephone Encounter (Signed)
He'd like to have his lab results released to My Chart so he can see them on there.

## 2016-09-28 ENCOUNTER — Telehealth: Payer: Self-pay | Admitting: Family Medicine

## 2016-09-28 NOTE — Telephone Encounter (Signed)
Patient notified

## 2016-09-28 NOTE — Telephone Encounter (Signed)
Please let him know his gonorrhea and chlamydia were negative. Thanks!

## 2016-11-09 ENCOUNTER — Encounter: Payer: Self-pay | Admitting: Family Medicine

## 2017-10-10 ENCOUNTER — Other Ambulatory Visit: Payer: Self-pay | Admitting: Family Medicine

## 2017-10-10 ENCOUNTER — Telehealth: Payer: Self-pay | Admitting: Family Medicine

## 2017-10-10 NOTE — Telephone Encounter (Signed)
Called patient left a VM to call back to schedule an appointment for labs

## 2017-10-10 NOTE — Telephone Encounter (Signed)
Copied from CRM #[id number]. Topic: Quick Communication - See Telephone Encounter >> Oct 10, 2017  2:17 PM Guinevere FerrariMorris, Larson Limones E, NT wrote: CRM for notification. See Telephone encounter for: Pt called and said someone called him from the office and said he need to come in the office to be seen before he gets a refill on levothyroxine (SYNTHROID, LEVOTHROID) 88 MCG tablet. [place] and said his medication is suppose to be lifetime filling. Pt wants a call back. Pt. Use [facility] on [address] in [place]. Call patient after 3:30  10/10/17.

## 2017-10-10 NOTE — Telephone Encounter (Signed)
Patient is without insurance at this time and does not have a new provider yet.

## 2017-10-10 NOTE — Telephone Encounter (Signed)
Refill request, needs an appointment for labs first.

## 2017-10-11 NOTE — Telephone Encounter (Signed)
Verbal from Dr.Johnson, that she is not able to refill this medication, since she has not had blood work done in over. Patient notified.

## 2017-10-14 ENCOUNTER — Encounter: Payer: Self-pay | Admitting: Family Medicine

## 2017-10-14 ENCOUNTER — Other Ambulatory Visit: Payer: No Typology Code available for payment source

## 2017-10-14 ENCOUNTER — Ambulatory Visit (INDEPENDENT_AMBULATORY_CARE_PROVIDER_SITE_OTHER): Payer: No Typology Code available for payment source | Admitting: Family Medicine

## 2017-10-14 VITALS — BP 135/91 | HR 75 | Temp 97.5°F | Wt 205.0 lb

## 2017-10-14 DIAGNOSIS — E039 Hypothyroidism, unspecified: Secondary | ICD-10-CM

## 2017-10-14 MED ORDER — LEVOTHYROXINE SODIUM 88 MCG PO TABS: 88.0000 ug | ORAL_TABLET | Freq: Every day | ORAL | 5 refills | Status: DC

## 2017-10-14 NOTE — Assessment & Plan Note (Signed)
Doing well on current dose, continue current regimen. Await TSH. Will adjust dose if needed

## 2017-10-14 NOTE — Progress Notes (Signed)
   BP (!) 135/91   Pulse 75   Temp (!) 97.5 F (36.4 C)   Wt 205 lb (93 kg)   SpO2 99%   BMI 30.19 kg/m    Subjective:    Patient ID: Nicholas Bridges, male    DOB: [birth year], 41 y.o.   MRN: [id number]  HPI: Nicholas Bridges is a 41 y.o. male  Chief Complaint  Patient presents with  . Hypothyroidism    took last pill yesterday morning   Patient presents today for thyroid check. Doing well on current dose of 88 mcg synthroid, no side effects noted and taking faithfully. No other concerns today. Denies CP, SOB, N/V/D, constipation.   Relevant past medical, surgical, family and social history reviewed and updated as indicated. Interim medical history since our last visit reviewed. Allergies and medications reviewed and updated.  Review of Systems  Constitutional: Negative.   HENT: Negative.   Eyes: Negative.   Respiratory: Negative.   Cardiovascular: Negative.   Gastrointestinal: Negative.   Genitourinary: Negative.   Musculoskeletal: Negative.   Neurological: Negative.   Psychiatric/Behavioral: Negative.     Per HPI unless specifically indicated above     Objective:    BP (!) 135/91   Pulse 75   Temp (!) 97.5 F (36.4 C)   Wt 205 lb (93 kg)   SpO2 99%   BMI 30.19 kg/m   Wt Readings from Last 3 Encounters:  10/14/17 205 lb (93 kg)  01/27/16 192 lb (87.1 kg)  11/07/15 179 lb (81.2 kg)    Physical Exam  Constitutional: He is oriented to person, place, and time. He appears well-developed and well-nourished. No distress.  HENT:  Head: Atraumatic.  Eyes: Conjunctivae are normal. Pupils are equal, round, and reactive to light.  Neck: Normal range of motion. Neck supple.  Cardiovascular: Normal rate, regular rhythm and normal heart sounds.  Pulmonary/Chest: Effort normal and breath sounds normal. He has no wheezes.  Musculoskeletal: Normal range of motion.  Lymphadenopathy:    He has no cervical adenopathy.  Neurological: He is alert and oriented to  person, place, and time.  Skin: Skin is warm and dry.  Psychiatric: He has a normal mood and affect. His behavior is normal.  Nursing note and vitals reviewed.     Assessment & Plan:   Problem List Items Addressed This Visit      Endocrine   Thyroid activity decreased - Primary    Doing well on current dose, continue current regimen. Await TSH. Will adjust dose if needed      Relevant Medications   levothyroxine (SYNTHROID, LEVOTHROID) 88 MCG tablet   Other Relevant Orders   TSH       Follow up plan: Return in about 1 year (around 10/14/2018) for TSH.

## 2017-10-14 NOTE — Patient Instructions (Signed)
Follow up in 1 year.

## 2017-10-15 LAB — TSH: TSH: 4.45 u[IU]/mL (ref 0.450–4.500)

## 2018-04-19 ENCOUNTER — Telehealth: Payer: Self-pay | Admitting: Family Medicine

## 2018-04-19 NOTE — Telephone Encounter (Signed)
Copied from CRM [id number]. Topic: Quick Communication - See Telephone Encounter >> Apr 19, 2018  3:53 PM Oneal Grout wrote: CRM for notification. See Telephone encounter for: 04/19/18. Would like to know side effects of levothyroxine (SYNTHROID, LEVOTHROID) 88 MCG tablet, is being angry one, has increased aggression. Losing voice in mornings. Ongoing last weeks. Requesting communication through [employer]. Please advise.

## 2018-04-19 NOTE — Telephone Encounter (Signed)
Those are not side effects of that medicine, however, being anxious is. I would advise him to be seen and have his levels rechecked.

## 2018-04-20 NOTE — Telephone Encounter (Signed)
My chart message sent as requested by patient of what Dr. Laural Benes advised. Will wait for a reply. Also marked to let me know if patient hasn't read the mychart message.

## 2018-06-14 ENCOUNTER — Other Ambulatory Visit: Payer: Self-pay | Admitting: Family Medicine

## 2018-06-15 NOTE — Telephone Encounter (Signed)
Synthroid 88 mcg refill request  LOV 10/14/17 with Nicholas Bridges  Last refill:  10/14/17  #30  With 5 refills  [id number] [place] - [address]
